# Patient Record
Sex: Female | Born: 1995 | Race: Black or African American | Hispanic: No | Marital: Single | Smoking: Never smoker
Health system: Southern US, Community
[De-identification: ages and names within clinical notes are randomized; demographics above are authoritative.]

## PROBLEM LIST (undated history)

## (undated) DIAGNOSIS — F32A Depression, unspecified: Secondary | ICD-10-CM

## (undated) DIAGNOSIS — F909 Attention-deficit hyperactivity disorder, unspecified type: Secondary | ICD-10-CM

---

## 2020-10-17 ENCOUNTER — Other Ambulatory Visit: Payer: Self-pay

## 2020-10-17 ENCOUNTER — Emergency Department (HOSPITAL_COMMUNITY)
Admission: EM | Admit: 2020-10-17 | Discharge: 2020-10-18 | Disposition: A | Discharge status: Home / Self Care | Payer: BC Managed Care – PPO | Attending: Emergency Medicine | Admitting: Emergency Medicine

## 2020-10-17 ENCOUNTER — Emergency Department (HOSPITAL_COMMUNITY): Payer: BC Managed Care – PPO

## 2020-10-17 ENCOUNTER — Encounter (HOSPITAL_COMMUNITY): Payer: Self-pay | Admitting: Emergency Medicine

## 2020-10-17 DIAGNOSIS — S199XXA Unspecified injury of neck, initial encounter: Secondary | ICD-10-CM | POA: Diagnosis present

## 2020-10-17 DIAGNOSIS — S40011A Contusion of right shoulder, initial encounter: Secondary | ICD-10-CM | POA: Diagnosis not present

## 2020-10-17 DIAGNOSIS — S161XXA Strain of muscle, fascia and tendon at neck level, initial encounter: Secondary | ICD-10-CM | POA: Diagnosis not present

## 2020-10-17 DIAGNOSIS — S39012A Strain of muscle, fascia and tendon of lower back, initial encounter: Secondary | ICD-10-CM | POA: Insufficient documentation

## 2020-10-17 DIAGNOSIS — Y92410 Unspecified street and highway as the place of occurrence of the external cause: Secondary | ICD-10-CM | POA: Diagnosis not present

## 2020-10-17 HISTORY — DX: Attention-deficit hyperactivity disorder, unspecified type: F90.9

## 2020-10-17 HISTORY — DX: Depression, unspecified: F32.A

## 2020-10-17 MED ORDER — ACETAMINOPHEN 500 MG PO TABS
1000.0000 mg | ORAL_TABLET | Freq: Once | ORAL | Status: AC
  Administered 2020-10-18: 1000 mg via ORAL
  Filled 2020-10-17: qty 2

## 2020-10-17 MED ORDER — IBUPROFEN 800 MG PO TABS
800.0000 mg | ORAL_TABLET | Freq: Once | ORAL | Status: AC
  Administered 2020-10-18: 800 mg via ORAL
  Filled 2020-10-17: qty 1
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
## 2020-10-18 DIAGNOSIS — S161XXA Strain of muscle, fascia and tendon at neck level, initial encounter: Secondary | ICD-10-CM | POA: Diagnosis not present

## 2020-10-18 MED ORDER — IBUPROFEN 800 MG PO TABS: 800.0000 mg | ORAL_TABLET | Freq: Four times a day (QID) | ORAL | 0 refills | Status: AC | PRN

## 2020-10-18 MED ORDER — CYCLOBENZAPRINE HCL 10 MG PO TABS: 10.0000 mg | ORAL_TABLET | Freq: Two times a day (BID) | ORAL | 0 refills | Status: AC | PRN

## 2021-11-25 IMAGING — DX DG CHEST 2V
2 series · 2 of 2 positions shown · non-contrast
Comparison: None.

CLINICAL DATA: Right shoulder pain limited range of motion

EXAM:
CHEST - 2 VIEW

[chest pa]
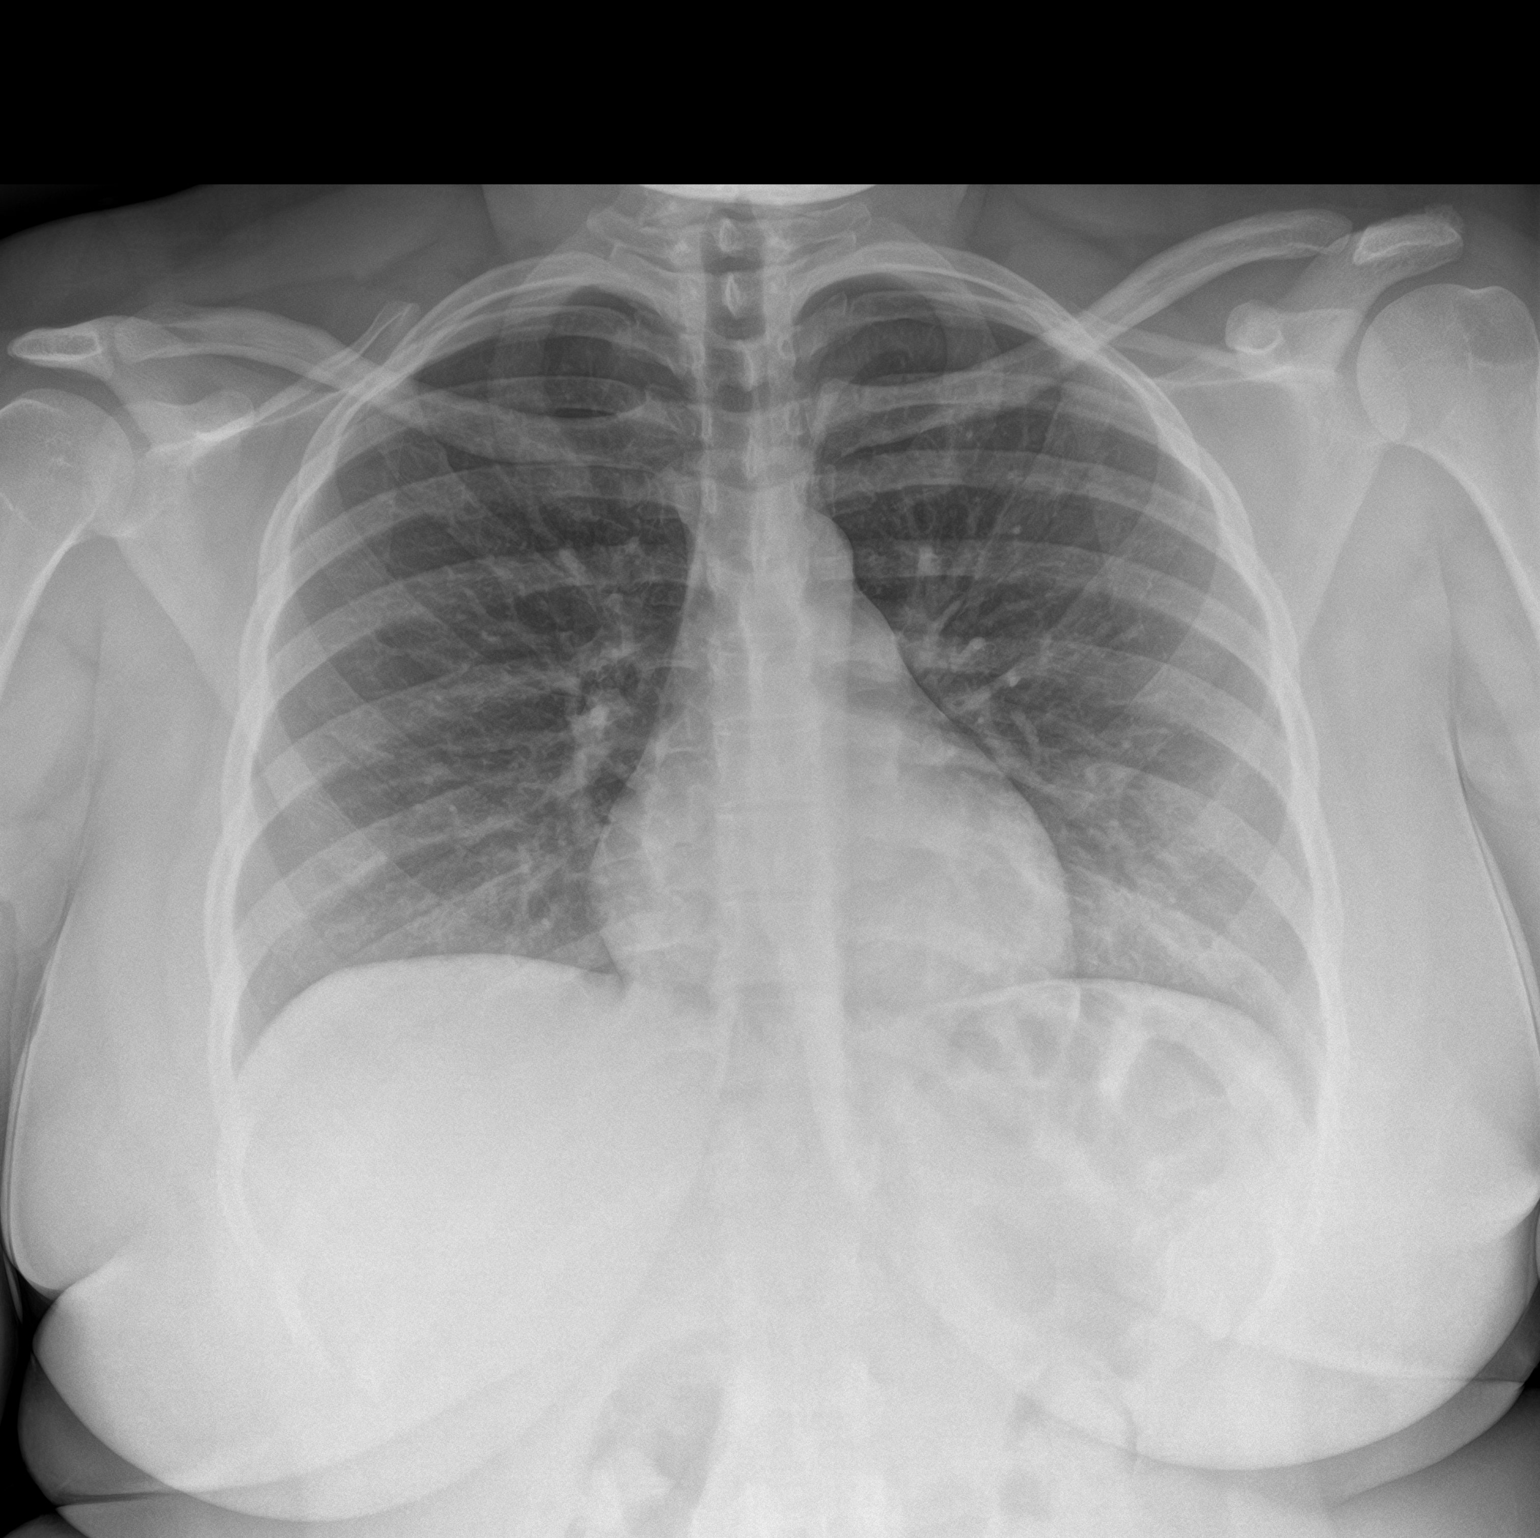

[chest lat]
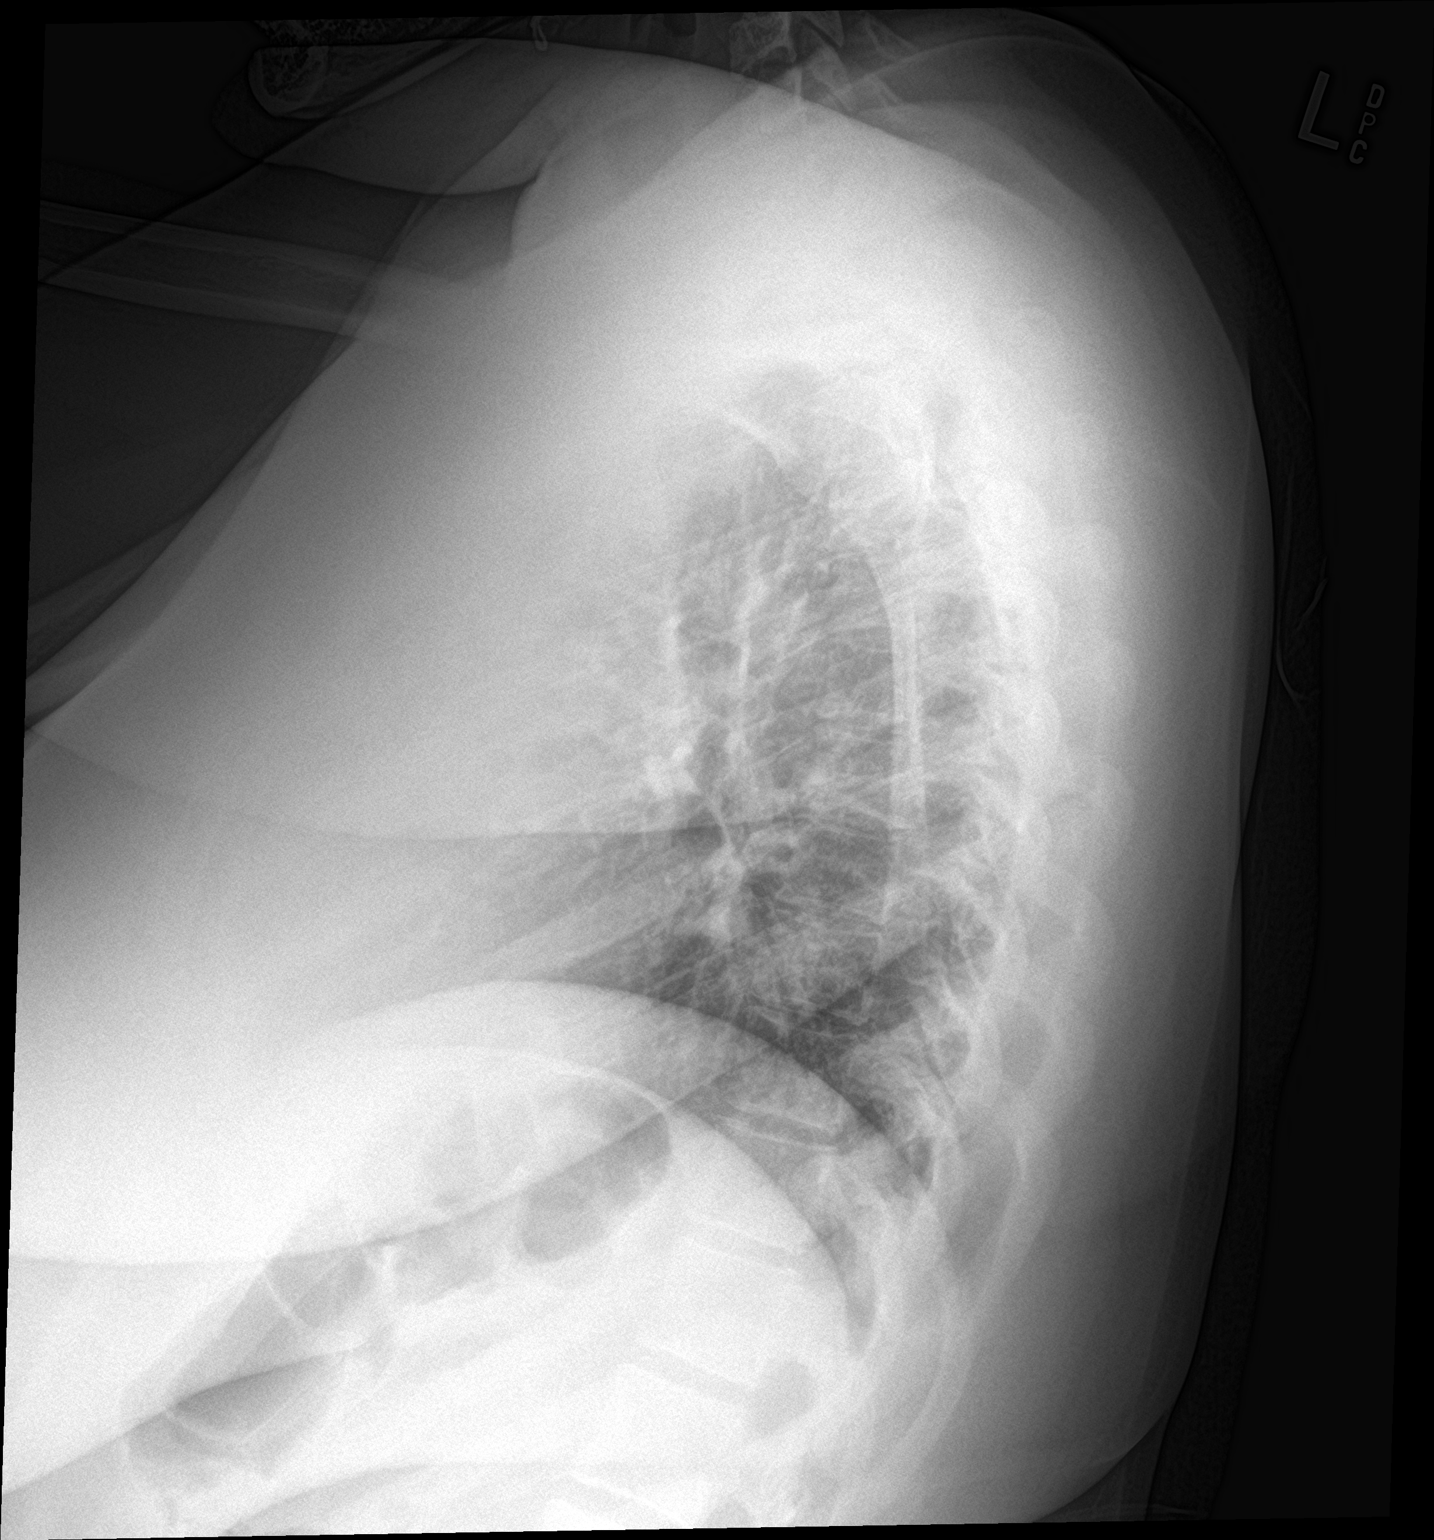

[2 of 2 positions shown; findings below may reference images not displayed]

FINDINGS: The heart size and mediastinal contours are within normal limits.
Both lungs are clear. The visualized skeletal structures are
unremarkable.
IMPRESSION: No active cardiopulmonary disease.
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
